# Patient Record
Sex: Male | Born: 1935 | Race: White | Hispanic: No | Marital: Married | Smoking: Never smoker
Health system: Southern US, Community
[De-identification: ages and names within clinical notes are randomized; demographics above are authoritative.]

## PROBLEM LIST (undated history)

## (undated) DIAGNOSIS — N200 Calculus of kidney: Secondary | ICD-10-CM

## (undated) DIAGNOSIS — Z9109 Other allergy status, other than to drugs and biological substances: Secondary | ICD-10-CM

## (undated) DIAGNOSIS — E7800 Pure hypercholesterolemia, unspecified: Secondary | ICD-10-CM

## (undated) HISTORY — PX: ROTATOR CUFF REPAIR: SHX139

---

## 2004-11-30 ENCOUNTER — Emergency Department (HOSPITAL_COMMUNITY): Admission: EM | Admit: 2004-11-30 | Discharge: 2004-11-30 | Payer: Self-pay | Admitting: *Deleted

## 2007-10-02 ENCOUNTER — Emergency Department (HOSPITAL_COMMUNITY): Admission: EM | Admit: 2007-10-02 | Discharge: 2007-10-02 | Payer: Self-pay | Admitting: Emergency Medicine

## 2009-10-12 ENCOUNTER — Emergency Department (HOSPITAL_COMMUNITY)
Admission: EM | Admit: 2009-10-12 | Discharge: 2009-10-12 | Payer: Self-pay | Source: Home / Self Care | Admitting: Emergency Medicine

## 2011-01-01 LAB — DIFFERENTIAL
Basophils Relative: 1
Eosinophils Relative: 2
Lymphs Abs: 0.9
Monocytes Relative: 10
Neutro Abs: 8.2 — ABNORMAL HIGH
Neutrophils Relative %: 79 — ABNORMAL HIGH

## 2011-01-01 LAB — CBC
MCHC: 34.3
MCV: 88.2
Platelets: 272

## 2011-01-01 LAB — POCT CARDIAC MARKERS
CKMB, poc: 6.3
Operator id: 257131
Troponin i, poc: <0.05

## 2011-01-01 LAB — POCT I-STAT, CHEM 8
Chloride: 100
Creatinine, Ser: 1.1
Glucose, Bld: 95
HCT: 41
Sodium: 135

## 2011-01-01 LAB — D-DIMER, QUANTITATIVE: D-Dimer, Quant: 1.18 — ABNORMAL HIGH

## 2012-12-14 ENCOUNTER — Emergency Department (HOSPITAL_COMMUNITY): Payer: Medicare Other

## 2012-12-14 ENCOUNTER — Emergency Department (HOSPITAL_COMMUNITY)
Admission: EM | Admit: 2012-12-14 | Discharge: 2012-12-14 | Disposition: A | Discharge status: Home / Self Care | Payer: Medicare Other | Attending: Emergency Medicine | Admitting: Emergency Medicine

## 2012-12-14 ENCOUNTER — Encounter (HOSPITAL_COMMUNITY): Payer: Self-pay | Admitting: Family Medicine

## 2012-12-14 DIAGNOSIS — Z87442 Personal history of urinary calculi: Secondary | ICD-10-CM | POA: Insufficient documentation

## 2012-12-14 DIAGNOSIS — W010XXA Fall on same level from slipping, tripping and stumbling without subsequent striking against object, initial encounter: Secondary | ICD-10-CM | POA: Insufficient documentation

## 2012-12-14 DIAGNOSIS — S79919A Unspecified injury of unspecified hip, initial encounter: Secondary | ICD-10-CM | POA: Insufficient documentation

## 2012-12-14 DIAGNOSIS — S76301A Unspecified injury of muscle, fascia and tendon of the posterior muscle group at thigh level, right thigh, initial encounter: Secondary | ICD-10-CM

## 2012-12-14 DIAGNOSIS — Z79899 Other long term (current) drug therapy: Secondary | ICD-10-CM | POA: Insufficient documentation

## 2012-12-14 DIAGNOSIS — E7800 Pure hypercholesterolemia, unspecified: Secondary | ICD-10-CM | POA: Insufficient documentation

## 2012-12-14 DIAGNOSIS — M79651 Pain in right thigh: Secondary | ICD-10-CM

## 2012-12-14 DIAGNOSIS — Y93H2 Activity, gardening and landscaping: Secondary | ICD-10-CM | POA: Insufficient documentation

## 2012-12-14 DIAGNOSIS — Y9289 Other specified places as the place of occurrence of the external cause: Secondary | ICD-10-CM | POA: Insufficient documentation

## 2012-12-14 DIAGNOSIS — IMO0002 Reserved for concepts with insufficient information to code with codable children: Secondary | ICD-10-CM | POA: Insufficient documentation

## 2012-12-14 HISTORY — DX: Calculus of kidney: N20.0

## 2012-12-14 HISTORY — DX: Pure hypercholesterolemia, unspecified: E78.00

## 2012-12-14 LAB — URINALYSIS, DIPSTICK ONLY
Nitrite: NEGATIVE
Specific Gravity, Urine: 1.009 (ref 1.005–1.030)

## 2012-12-14 MED ORDER — METHOCARBAMOL 500 MG PO TABS
500.0000 mg | ORAL_TABLET | Freq: Once | ORAL | Status: AC
  Administered 2012-12-14: 500 mg via ORAL
  Filled 2012-12-14: qty 1

## 2012-12-14 MED ORDER — HYDROCODONE-ACETAMINOPHEN 5-325 MG PO TABS: 1.0000 | ORAL_TABLET | Freq: Four times a day (QID) | ORAL | Status: AC | PRN

## 2012-12-14 MED ORDER — MORPHINE SULFATE 4 MG/ML IJ SOLN
4.0000 mg | Freq: Once | INTRAMUSCULAR | Status: AC
  Administered 2012-12-14: 4 mg via INTRAMUSCULAR
  Filled 2012-12-14: qty 1

## 2012-12-14 MED ORDER — OXYCODONE-ACETAMINOPHEN 5-325 MG PO TABS
1.0000 | ORAL_TABLET | Freq: Once | ORAL | Status: AC
  Administered 2012-12-14: 1 via ORAL
  Filled 2012-12-14: qty 1

## 2012-12-14 NOTE — ED Notes (Signed)
Called x 2 no answer

## 2012-12-14 NOTE — ED Notes (Signed)
Pt presents with right leg pain located beneath buttocks that radiates down to right knee, pt states when he stands the pain is unbearable.  Pt has had the pain since Friday- has been taking Motrin at home for pain without relief.  Pedal pulses strong and intact.  Heat pack applied to area.

## 2012-12-14 NOTE — ED Notes (Signed)
Per pt sts Friday he fell while trimming some branches out of a tree and injured right hip. sts the pain became worse over the weekend and then this morning it gave out and he fell again. Pt able to ambulate but with severe pain.

## 2012-12-14 NOTE — ED Provider Notes (Signed)
CSN: [id number]     Arrival date & time 12/14/12  1301 History   First MD Initiated Contact with Patient 12/14/12 1646     Chief Complaint  Patient presents with  . Fall  . Hip Pain   (Consider location/radiation/quality/duration/timing/severity/associated sxs/prior Treatment) Patient is a 77 y.o. male presenting with fall. The history is provided by the patient. No language interpreter was used.  Fall This is a new problem. The current episode started in the past 7 days. The problem occurs constantly. The problem has been unchanged. Pertinent negatives include no abdominal pain, chest pain, congestion, coughing, fever, headaches, joint swelling, nausea, rash, sore throat or vomiting. The symptoms are aggravated by twisting. He has tried NSAIDs and oral narcotics for the symptoms. The treatment provided mild relief.    Past Medical History  Diagnosis Date  . High cholesterol   . Kidney stone    History reviewed. No pertinent past surgical history. History reviewed. No pertinent family history. History  Substance Use Topics  . Smoking status: Never Smoker   . Smokeless tobacco: Not on file  . Alcohol Use: No    Review of Systems  Constitutional: Negative for fever.  HENT: Negative for congestion, sore throat and rhinorrhea.   Respiratory: Negative for cough and shortness of breath.   Cardiovascular: Negative for chest pain.  Gastrointestinal: Negative for nausea, vomiting, abdominal pain and diarrhea.  Genitourinary: Negative for dysuria and hematuria.  Musculoskeletal: Negative for joint swelling.  Skin: Negative for rash.  Neurological: Negative for syncope, light-headedness and headaches.  All other systems reviewed and are negative.    Allergies  Contrast media; Corn-containing products; Milk-related compounds; and Shrimp  Home Medications   Current Outpatient Rx  Name  Route  Sig  Dispense  Refill  . cetirizine (ZYRTEC) 10 MG tablet   Oral   Take 10 mg by mouth  daily.         Marland Kitchen dipyridamole-aspirin (AGGRENOX) 200-25 MG per 12 hr capsule   Oral   Take 1 capsule by mouth 2 (two) times daily.         . fish oil-omega-3 fatty acids 1000 MG capsule   Oral   Take 2 g by mouth 3 (three) times daily.         . fluticasone (FLONASE) 50 MCG/ACT nasal spray   Nasal   Place 1 spray into the nose daily.         Marland Kitchen gabapentin (NEURONTIN) 600 MG tablet   Oral   Take 600 mg by mouth 3 (three) times daily.         . montelukast (SINGULAIR) 10 MG tablet   Oral   Take 10 mg by mouth at bedtime.         . Multiple Vitamin (MULTIVITAMIN WITH MINERALS) TABS tablet   Oral   Take 1 tablet by mouth daily.         . Saw Palmetto 450 MG CAPS   Oral   Take by mouth.         . simvastatin (ZOCOR) 10 MG tablet   Oral   Take 10 mg by mouth at bedtime.          BP 144/73  Pulse 62  Temp(Src) 98.3 F (36.8 C)  Resp 18  SpO2 97% Physical Exam  Nursing note and vitals reviewed. Constitutional: He is oriented to person, place, and time. He appears well-developed and well-nourished.  HENT:  Head: Normocephalic and atraumatic.  Right Ear: External ear  normal.  Left Ear: External ear normal.  Eyes: EOM are normal. Pupils are equal, round, and reactive to light.  Neck: Normal range of motion. Neck supple.  Cardiovascular: Normal rate, regular rhythm and intact distal pulses.  Exam reveals no gallop and no friction rub.   No murmur heard. Pulmonary/Chest: Effort normal and breath sounds normal. No respiratory distress. He has no wheezes. He has no rales. He exhibits no tenderness.  Abdominal: Soft. Bowel sounds are normal. He exhibits no distension. There is no tenderness. There is no rebound.  Musculoskeletal: He exhibits tenderness. He exhibits no edema.  Right Lower Extremity INSPECTION: ecchymosis of posterior thigh PALPATION: No TTP ROM: Decreased global ROM due to pain. VASCULAR: Extremity warm and well-perfused. 2+ dosalis pedis  and posterior tibialis pulses. Capillary refill <2 seconds NEURO-SENSORY: Normal sensibility to light touch in DP/SP/sural/saphenous distributions. No numbness or paresthesias. No focal sensory deficit. NEURO-MOTOR: Intact EHL/FHL/TA/GS motor function. No focal motor deficit.  Lymphadenopathy:    He has no cervical adenopathy.  Neurological: He is alert and oriented to person, place, and time.  Skin: Skin is warm. No rash noted.  Psychiatric: He has a normal mood and affect. His behavior is normal.    ED Course  Procedures (including critical care time) Labs Review Labs Reviewed  URINALYSIS, DIPSTICK ONLY   Imaging Review Dg Hip Complete Right  12/14/2012   CLINICAL DATA:  Pain post fall 1 week ago , right hip pain radiating in right leg  EXAM: RIGHT HIP - COMPLETE 2+ VIEW  COMPARISON:  None.  FINDINGS: There is no evidence of hip fracture or dislocation. Mild degenerative changes are noted with bilateral of superior or hip joint space. Minimal superior acetabular spurring.  IMPRESSION: No acute fracture or subluxation. Mild degenerative changes right hip joint.   Electronically Signed   By: Natasha Mead   On: 12/14/2012 14:36    MDM   1. Hamstring injury, right, initial encounter   2. Right thigh pain    4:47 PM Pt is a 77 y.o. male with pertinent PMHX of TIA, HLD who presents to the ED with fall, hip pain. Pt presents with injury to right lower extremity sustained on Friday after falling backwards with a twisting motion. Pt denies LOC, denies amnesia to the event. Denies syncope or lightheadedness prior to fall. Did not strike hip directly. Pt endorses some dysuria. No fevers  On exam AFVSS, NVI, , pulses present. Sensation and motor intact. Plan to obtain XR right hip complete. Will give 1 time dose morphine IM and robaxin PO.  Urine dip negative for blood, pt concerned he had hematuria.  hip and pelvis AP/LAt per my read showed no acute fractures or dislocation. Pt able to ambulate  without difficulty. Pt has pain with twisting and turning on right leg consistent with hamstring injury. No pain with bearing weight, low clinical suspicion for acute or occult hip fracture. Plan for discharge with close return precautions. Pt given script for Vicodin and instructed to use ibuprofen for pain also. Pt in agreement with plan  6:08 PM: I have discussed the diagnosis/risks/treatment options with the patient and family and believe the pt to be eligible for discharge home to follow-up with PCP in 1 week. We also discussed returning to the ED immediately if new or worsening sx occur. We discussed the sx which are most concerning (e.g., worsening pain after 10 days) that necessitate immediate return. Any new prescriptions provided to the patient are listed below.  Discharge Medication List as of 12/14/2012  6:10 PM    START taking these medications   Details  HYDROcodone-acetaminophen (NORCO/VICODIN) 5-325 MG per tablet Take 1 tablet by mouth every 6 (six) hours as needed for pain., Starting 12/14/2012, Until Discontinued, Print       The patient appears reasonably screened and/or stabilized for discharge and I doubt any other medical condition or other [facility] requiring further screening, evaluation or treatment in the ED at this time prior to discharge . Pt in agreement with discharge plan. Return precautions given. Pt discharged VSS  Imaging reviewed by myself and considered in medical decision making if ordered.  Imaging interpreted by radiology. Pt was discussed with my attending, Dr. Doristine Mango, MD 12/15/12 [phone]

## 2012-12-15 NOTE — ED Provider Notes (Signed)
I saw and evaluated the patient, reviewed the resident's note and I agree with the findings and plan.   .Face to face Exam:  General:  Awake HEENT:  Atraumatic Resp:  Normal effort Abd:  Nondistended Neuro:No focal weakness   Nelia Shi, MD 12/15/12 2214

## 2014-10-13 ENCOUNTER — Encounter (HOSPITAL_BASED_OUTPATIENT_CLINIC_OR_DEPARTMENT_OTHER): Payer: Self-pay | Admitting: *Deleted

## 2014-10-13 ENCOUNTER — Emergency Department (HOSPITAL_BASED_OUTPATIENT_CLINIC_OR_DEPARTMENT_OTHER)
Admission: EM | Admit: 2014-10-13 | Discharge: 2014-10-13 | Disposition: A | Discharge status: Home / Self Care | Payer: Medicare Other | Attending: Emergency Medicine | Admitting: Emergency Medicine

## 2014-10-13 DIAGNOSIS — S70362A Insect bite (nonvenomous), left thigh, initial encounter: Secondary | ICD-10-CM | POA: Diagnosis not present

## 2014-10-13 DIAGNOSIS — E780 Pure hypercholesterolemia: Secondary | ICD-10-CM | POA: Insufficient documentation

## 2014-10-13 DIAGNOSIS — Y9389 Activity, other specified: Secondary | ICD-10-CM | POA: Diagnosis not present

## 2014-10-13 DIAGNOSIS — Y998 Other external cause status: Secondary | ICD-10-CM | POA: Insufficient documentation

## 2014-10-13 DIAGNOSIS — Z8709 Personal history of other diseases of the respiratory system: Secondary | ICD-10-CM | POA: Insufficient documentation

## 2014-10-13 DIAGNOSIS — S30861A Insect bite (nonvenomous) of abdominal wall, initial encounter: Secondary | ICD-10-CM | POA: Diagnosis present

## 2014-10-13 DIAGNOSIS — Z87442 Personal history of urinary calculi: Secondary | ICD-10-CM | POA: Diagnosis not present

## 2014-10-13 DIAGNOSIS — Y9289 Other specified places as the place of occurrence of the external cause: Secondary | ICD-10-CM | POA: Insufficient documentation

## 2014-10-13 DIAGNOSIS — Z79899 Other long term (current) drug therapy: Secondary | ICD-10-CM | POA: Insufficient documentation

## 2014-10-13 DIAGNOSIS — Z7951 Long term (current) use of inhaled steroids: Secondary | ICD-10-CM | POA: Insufficient documentation

## 2014-10-13 DIAGNOSIS — W57XXXA Bitten or stung by nonvenomous insect and other nonvenomous arthropods, initial encounter: Secondary | ICD-10-CM | POA: Diagnosis not present

## 2014-10-13 HISTORY — DX: Other allergy status, other than to drugs and biological substances: Z91.09

## 2014-10-13 MED ORDER — DOXYCYCLINE HYCLATE 100 MG PO CAPS: 100.0000 mg | ORAL_CAPSULE | Freq: Two times a day (BID) | ORAL | Status: AC

## 2014-10-13 MED ORDER — HYDROXYZINE HCL 25 MG PO TABS: 25.0000 mg | ORAL_TABLET | Freq: Four times a day (QID) | ORAL | Status: AC

## 2014-10-13 NOTE — ED Provider Notes (Signed)
CSN: [id number]   Arrival date & time 10/13/14 1506  History  This chart was scribed for  Thomas Lyons, MD by Bethel Born, ED Scribe. This patient was seen in room MH07/MH07 and the patient's care was started at [facility] PM.  Chief Complaint  Patient presents with  . Insect Bite    HPI Patient is a 79 y.o. male presenting with animal bite. The history is provided by the patient and the spouse. No language interpreter was used.  Animal Bite Contact animal:  Insect Location:  Torso and leg Torso injury location:  Abd RLQ Leg injury location:  L hip Time since incident:  3 days Pain details:    Quality:  Itching   Severity:  Severe   Timing:  Constant   Progression:  Worsening Relieved by:  Nothing Worsened by:  Nothing tried Ineffective treatments:  None tried Associated symptoms: swelling   Associated symptoms: no fever    Thomas Reese is a 79 y.o. male who presents to the Emergency Department complaining of tick bites to the left hip with onset 3 days ago. The pt scratched the "tiny" tick off in the shower. He noticed another tick on the right side today. Associated itching, redness, and swelling at both sites. No fever or chills.   Past Medical History  Diagnosis Date  . High cholesterol   . Kidney stone   . Environmental allergies     Past Surgical History  Procedure Laterality Date  . Rotator cuff repair      No family history on file.  History  Substance Use Topics  . Smoking status: Never Smoker   . Smokeless tobacco: Never Used  . Alcohol Use: No     Review of Systems  Constitutional: Negative for fever and chills.  Skin: Positive for itching.       2 areas of redness and swelling  All other systems reviewed and are negative.   Home Medications   Prior to Admission medications   Medication Sig Start Date End Date Taking? Authorizing Provider  cetirizine (ZYRTEC) 10 MG tablet Take 10 mg by mouth daily.   Yes Historical Provider, MD   dipyridamole-aspirin (AGGRENOX) 200-25 MG per 12 hr capsule Take 1 capsule by mouth 2 (two) times daily.   Yes Historical Provider, MD  fish oil-omega-3 fatty acids 1000 MG capsule Take 2 g by mouth 3 (three) times daily.   Yes Historical Provider, MD  fluticasone (FLONASE) 50 MCG/ACT nasal spray Place 1 spray into the nose daily.   Yes Historical Provider, MD  gabapentin (NEURONTIN) 600 MG tablet Take 600 mg by mouth 3 (three) times daily.   Yes Historical Provider, MD  montelukast (SINGULAIR) 10 MG tablet Take 10 mg by mouth at bedtime.   Yes Historical Provider, MD  Multiple Vitamin (MULTIVITAMIN WITH MINERALS) TABS tablet Take 1 tablet by mouth daily.   Yes Historical Provider, MD  Saw Palmetto 450 MG CAPS Take by mouth.   Yes Historical Provider, MD  simvastatin (ZOCOR) 10 MG tablet Take 10 mg by mouth at bedtime.   Yes Historical Provider, MD  HYDROcodone-acetaminophen (NORCO/VICODIN) 5-325 MG per tablet Take 1 tablet by mouth every 6 (six) hours as needed for pain. 12/14/12   Donalee Citrin, MD    Allergies  Contrast media  Triage Vitals: BP 136/58 mmHg  Pulse 67  Temp(Src) 98 F (36.7 C) (Oral)  Resp 18  Ht 5' 8.5" (1.74 m)  Wt 187 lb (84.823 kg)  BMI 28.02 kg/m2  SpO2 96%  Physical Exam  Constitutional: He is oriented to person, place, and time and well-developed, well-nourished, and in no distress. No distress.  HENT:  Head: Normocephalic and atraumatic.  Neck: Normal range of motion. Neck supple.  Neurological: He is alert and oriented to person, place, and time.  Skin: He is not diaphoretic.  There is an area of redness, swelling, and warmth with a central puncture noted to the right lower abdominal wall. The area of redness measures approximately 8 cm.  There is another area on the left thigh with lesser erythema and central puncture.   Nursing note and vitals reviewed.   ED Course  Procedures   DIAGNOSTIC STUDIES: Oxygen Saturation is 96% on RA, normal by my  interpretation.    COORDINATION OF CARE: 5:08 PM Discussed treatment plan which includes discharge with doxycycline and hydroxyzine with pt at bedside and pt agreed to plan.  Labs Review- Labs Reviewed - No data to display  Imaging Review No results found.  EKG Interpretation None      MDM   Final diagnoses:  None     [name] presents with a reddened area to his left thigh after right waist removing 2 takes. The area to his abdomen is red, warm, and swollen. This will be treated with doxycycline and when necessary return.   I personally performed the services described in this documentation, which was scribed in my presence. The recorded information has been reviewed and is accurate.      Thomas Lyonsouglas Alivia Cimino, MD 10/15/14 [phone]

## 2014-10-13 NOTE — ED Notes (Signed)
Pt reports he had tick bite to left thigh on Thursday, then another to waist yesterday- both areas red and swollen

## 2014-10-13 NOTE — Discharge Instructions (Signed)
Doxycycline as prescribed.  Hydroxyzine as prescribed as needed for itching.  Return to the emergency department if symptoms significantly worsen or change.   Tick Bite Information Ticks are insects that attach themselves to the skin and draw blood for food. There are various types of ticks. Common types include wood ticks and deer ticks. Most ticks live in shrubs and grassy areas. Ticks can climb onto your body when you make contact with leaves or grass where the tick is waiting. The most common places on the body for ticks to attach themselves are the scalp, neck, armpits, waist, and groin. Most tick bites are harmless, but sometimes ticks carry germs that cause diseases. These germs can be spread to a person during the tick's feeding process. The chance of a disease spreading through a tick bite depends on:   The type of tick.  Time of year.   How long the tick is attached.   Geographic location.  HOW CAN YOU PREVENT TICK BITES? Take these steps to help prevent tick bites when you are outdoors:  Wear protective clothing. Long sleeves and long pants are best.   Wear white clothes so you can see ticks more easily.  Tuck your pant legs into your socks.   If walking on a trail, stay in the middle of the trail to avoid brushing against bushes.  Avoid walking through areas with long grass.  Put insect repellent on all exposed skin and along boot tops, pant legs, and sleeve cuffs.   Check clothing, hair, and skin repeatedly and before going inside.   Brush off any ticks that are not attached.  Take a shower or bath as soon as possible after being outdoors.  WHAT IS THE PROPER WAY TO REMOVE A TICK? Ticks should be removed as soon as possible to help prevent diseases caused by tick bites. 1. If latex gloves are available, put them on before trying to remove a tick.  2. Using fine-point tweezers, grasp the tick as close to the skin as possible. You may also use curved  forceps or a tick removal tool. Grasp the tick as close to its head as possible. Avoid grasping the tick on its body. 3. Pull gently with steady upward pressure until the tick lets go. Do not twist the tick or jerk it suddenly. This may break off the tick's head or mouth parts. 4. Do not squeeze or crush the tick's body. This could force disease-carrying fluids from the tick into your body.  5. After the tick is removed, wash the bite area and your hands with soap and water or other disinfectant such as alcohol. 6. Apply a small amount of antiseptic cream or ointment to the bite site.  7. Wash and disinfect any instruments that were used.  Do not try to remove a tick by applying a hot match, petroleum jelly, or fingernail polish to the tick. These methods do not work and may increase the chances of disease being spread from the tick bite.  WHEN SHOULD YOU SEEK MEDICAL CARE? Contact your health care provider if you are unable to remove a tick from your skin or if a part of the tick breaks off and is stuck in the skin.  After a tick bite, you need to be aware of signs and symptoms that could be related to diseases spread by ticks. Contact your health care provider if you develop any of the following in the days or weeks after the tick bite:  Unexplained fever.  Rash. A circular rash that appears days or weeks after the tick bite may indicate the possibility of Lyme disease. The rash may resemble a target with a bull's-eye and may occur at a different part of your body than the tick bite.  Redness and swelling in the area of the tick bite.   Tender, swollen lymph glands.   Diarrhea.   Weight loss.   Cough.   Fatigue.   Muscle, joint, or bone pain.   Abdominal pain.   Headache.   Lethargy or a change in your level of consciousness.  Difficulty walking or moving your legs.   Numbness in the legs.   Paralysis.  Shortness of breath.   Confusion.   Repeated  vomiting.  Document Released: 03/20/2000 Document Revised: 01/11/2013 Document Reviewed: 08/31/2012 [facility] Patient Information 2015 [employer], [place]. This information is not intended to replace advice given to you by your health care provider. Make sure you discuss any questions you have with your health care provider.

## 2014-10-13 NOTE — ED Notes (Signed)
MD at bedside. 

## 2017-09-09 ENCOUNTER — Encounter (HOSPITAL_BASED_OUTPATIENT_CLINIC_OR_DEPARTMENT_OTHER): Payer: Self-pay | Admitting: *Deleted

## 2017-09-09 ENCOUNTER — Emergency Department (HOSPITAL_BASED_OUTPATIENT_CLINIC_OR_DEPARTMENT_OTHER): Payer: Medicare Other

## 2017-09-09 ENCOUNTER — Other Ambulatory Visit: Payer: Self-pay

## 2017-09-09 ENCOUNTER — Emergency Department (HOSPITAL_BASED_OUTPATIENT_CLINIC_OR_DEPARTMENT_OTHER)
Admission: EM | Admit: 2017-09-09 | Discharge: 2017-09-09 | Disposition: A | Discharge status: Home / Self Care | Payer: Medicare Other | Attending: Emergency Medicine | Admitting: Emergency Medicine

## 2017-09-09 DIAGNOSIS — Y9301 Activity, walking, marching and hiking: Secondary | ICD-10-CM | POA: Diagnosis not present

## 2017-09-09 DIAGNOSIS — Y929 Unspecified place or not applicable: Secondary | ICD-10-CM | POA: Insufficient documentation

## 2017-09-09 DIAGNOSIS — Z23 Encounter for immunization: Secondary | ICD-10-CM | POA: Insufficient documentation

## 2017-09-09 DIAGNOSIS — Y999 Unspecified external cause status: Secondary | ICD-10-CM | POA: Insufficient documentation

## 2017-09-09 DIAGNOSIS — Z79899 Other long term (current) drug therapy: Secondary | ICD-10-CM | POA: Insufficient documentation

## 2017-09-09 DIAGNOSIS — S59912A Unspecified injury of left forearm, initial encounter: Secondary | ICD-10-CM | POA: Diagnosis present

## 2017-09-09 DIAGNOSIS — W228XXA Striking against or struck by other objects, initial encounter: Secondary | ICD-10-CM | POA: Insufficient documentation

## 2017-09-09 DIAGNOSIS — S51812A Laceration without foreign body of left forearm, initial encounter: Secondary | ICD-10-CM

## 2017-09-09 MED ORDER — LIDOCAINE-EPINEPHRINE (PF) 2 %-1:200000 IJ SOLN
10.0000 mL | Freq: Once | INTRAMUSCULAR | Status: DC
  Filled 2017-09-09: qty 10

## 2017-09-09 MED ORDER — TETANUS-DIPHTH-ACELL PERTUSSIS 5-2.5-18.5 LF-MCG/0.5 IM SUSP
0.5000 mL | Freq: Once | INTRAMUSCULAR | Status: AC
  Administered 2017-09-09: 0.5 mL via INTRAMUSCULAR
  Filled 2017-09-09: qty 0.5

## 2017-09-09 MED ORDER — BACITRACIN ZINC 500 UNIT/GM EX OINT
TOPICAL_OINTMENT | Freq: Once | CUTANEOUS | Status: AC
  Administered 2017-09-09: 17:00:00 via TOPICAL

## 2017-09-09 NOTE — ED Triage Notes (Signed)
Pt c/o left arm lac by fall on stone x 2 hrs ago

## 2017-09-09 NOTE — ED Notes (Signed)
ED Provider at bedside for lac repair 

## 2017-09-09 NOTE — Discharge Instructions (Signed)
It was my pleasure taking care of you today!   Keep wound clean with mild soap and water. Keep area covered with a topical antibiotic ointment and bandage, keep bandage dry, and do not submerge in water for 24 hours. Ice and elevate for additional pain relief and swelling. Alternate between ibuprofen and Tylenol for additional pain relief. Follow up with your primary care doctor, Urgent Care Center or ER in approximately 7-10 days for wound recheck and suture removal. Monitor area for signs of infection to include, but not limited to: increasing pain, spreading redness, drainage/pus, worsening swelling, or fevers. Return to emergency department for emergent changing or worsening symptoms.   WOUND CARE Keep area clean and dry for 24 hours. Do not remove bandage, if applied. After 24 hours,you should change it at least once a day. Also, change the dressing if it becomes wet or dirty, or as directed by your caregiver.  Wash the wound with soap and water 2 times a day. Rinse the wound off with water to remove all soap. Pat the wound dry with a clean towel.  You may shower as usual after the first 24 hours. Do not soak the wound in water until the sutures are removed.  Return if you experience any of the following signs of infection: Swelling, redness, pus drainage, streaking, fever >101.0 F Return if you experience excessive bleeding that does not stop after 15-20 minutes of constant, firm pressure.

## 2017-09-09 NOTE — ED Provider Notes (Signed)
[facility] EMERGENCY DEPARTMENT Provider Note   CSN: [id number] Arrival date & time: 09/09/17  1509     History   Chief Complaint Chief Complaint  Patient presents with  . Laceration    HPI Thomas Reese is a 82 y.o. male.  The history is provided by the patient and medical records. No language interpreter was used.  Laceration     Thomas Reese is a 82 y.o. male  with a PMH as listed below who presents to the Emergency Department complaining of laceration to the left forearm occurred just prior to arrival.  Patient states that he was walking when he tripped and hit left forearm on a cement block.  He denies any pain to the arm other than the laceration site itself.  His wife applied tape to the area, but has not cleaned it or taken any medications.  He is on Aggrenox which has about 25 mg of aspirin in it, but no other anticoagulation.  Numbness, tingling or weakness.  He did not hit his head.  Denies any other injuries.  Past Medical History:  Diagnosis Date  . Environmental allergies   . High cholesterol   . Kidney stone     There are no active problems to display for this patient.   Past Surgical History:  Procedure Laterality Date  . ROTATOR CUFF REPAIR          Home Medications    Prior to Admission medications   Medication Sig Start Date End Date Taking? Authorizing Provider  cetirizine (ZYRTEC) 10 MG tablet Take 10 mg by mouth daily.    [provider]  dipyridamole-aspirin (AGGRENOX) 200-25 MG per 12 hr capsule Take 1 capsule by mouth 2 (two) times daily.    [provider]  doxycycline (VIBRAMYCIN) 100 MG capsule Take 1 capsule (100 mg total) by mouth 2 (two) times daily. One po bid x 7 days 10/13/14   Geoffery Lyons, MD  fish oil-omega-3 fatty acids 1000 MG capsule Take 2 g by mouth 3 (three) times daily.    [provider]  fluticasone (FLONASE) 50 MCG/ACT nasal spray Place 1 spray into the nose daily.    [provider]  gabapentin (NEURONTIN) 600 MG tablet Take 600 mg by mouth 3 (three) times daily.    [provider]  HYDROcodone-acetaminophen (NORCO/VICODIN) 5-325 MG per tablet Take 1 tablet by mouth every 6 (six) hours as needed for pain. 12/14/12   Donalee Citrin, MD  hydrOXYzine (ATARAX/VISTARIL) 25 MG tablet Take 1 tablet (25 mg total) by mouth every 6 (six) hours. 10/13/14   Geoffery Lyons, MD  montelukast (SINGULAIR) 10 MG tablet Take 10 mg by mouth at bedtime.    [provider]  Multiple Vitamin (MULTIVITAMIN WITH MINERALS) TABS tablet Take 1 tablet by mouth daily.    [provider]  Saw Palmetto 450 MG CAPS Take by mouth.    [provider]  simvastatin (ZOCOR) 10 MG tablet Take 10 mg by mouth at bedtime.    [provider]    Family History History reviewed. No pertinent family history.  Social History Social History   Tobacco Use  . Smoking status: Never Smoker  . Smokeless tobacco: Never Used  Substance Use Topics  . Alcohol use: No  . Drug use: No     Allergies   Contrast media [iodinated diagnostic agents]   Review of Systems Review of Systems  Musculoskeletal: Positive for myalgias. Negative for arthralgias.  Skin: Positive for wound.  Neurological: Negative for weakness and numbness.     Physical Exam Updated Vital Signs BP 138/79   Pulse 85   Temp 98.4 F (36.9 C)   Resp 18   Ht 5\' 8"  (1.727 m)   Wt 81.6 kg (180 lb)   SpO2 96%   BMI 27.37 kg/m   Physical Exam  Constitutional: He appears well-developed and well-nourished. No distress.  HENT:  Head: Normocephalic and atraumatic.  Neck: Neck supple.  Cardiovascular: Normal rate, regular rhythm and normal heart sounds.  No murmur heard. Pulmonary/Chest: Effort normal and breath sounds normal. No respiratory distress. He has no wheezes. He has no rales.  Musculoskeletal:  Left upper extremity with full ROM and 5/5 strength. Moves all fingers without  difficulty.  Sensation intact to radial, median and ulnar nerve distributions.  Good cap refill.  Good grip strength.  Neurological: He is alert.  Skin: Skin is warm and dry.  2.5 cm laceration to left forearm  Nursing note and vitals reviewed.    ED Treatments / Results  Labs (all labs ordered are listed, but only abnormal results are displayed) Labs Reviewed - No data to display  EKG None  Radiology Dg Forearm Left  Result Date: 09/09/2017 CLINICAL DATA:  Patient fell and landed on stones, causing laceration. Pain. EXAM: LEFT FOREARM - 2 VIEW COMPARISON:  None. FINDINGS: No fracture or dislocation. Mild dorsal soft tissue swelling. A few tiny radiodensities are observed on the lateral radiograph, but cannot be seen on the AP view, which could represent foreign material. Correlate clinically. IMPRESSION: No visible fracture. A few tiny radiodensities are seen overlying an area of soft tissue swelling. See discussion above. Electronically Signed   By: Elsie Stain M.D.   On: 09/09/2017 16:06    Procedures .Marland KitchenLaceration Repair Date/Time: 09/09/2017 4:31 PM Performed by: Asir Bingley, Chase Picket, PA-C Authorized by: Arilyn Brierley, Chase Picket, PA-C   Consent:    Consent obtained:  Verbal   Consent given by:  Patient   Risks discussed:  Pain, infection, poor cosmetic result and poor wound healing Anesthesia (see MAR for exact dosages):    Anesthesia method:  Local infiltration   Local anesthetic:  Lidocaine 2% WITH epi Laceration details:    Location:  Shoulder/arm   Shoulder/arm location:  L lower arm   Length (cm):  2.5 Repair type:    Repair type:  Simple Pre-procedure details:    Preparation:  Patient was prepped and draped in usual sterile fashion and imaging obtained to evaluate for foreign bodies Exploration:    Hemostasis achieved with:  Direct pressure and epinephrine   Wound exploration: wound explored through full range of motion and entire depth of wound probed and visualized     Treatment:    Area cleansed with:  Saline and Betadine   Amount of cleaning:  Extensive   Irrigation solution:  Sterile saline   Irrigation volume:  1L   Irrigation method:  Syringe   Visualized foreign bodies/material removed: yes   Mucous membrane repair:    Suture size:  5-0   Suture material:  Vicryl   Suture technique:  Simple interrupted   Number of sutures:  1 Skin repair:    Repair method:  Sutures   Suture size:  5-0   Suture material:  Prolene   Suture technique:  Simple interrupted   Number of sutures:  4 Approximation:    Approximation:  Close Post-procedure details:    Dressing:  Antibiotic ointment  Patient tolerance of procedure:  Tolerated well, no immediate complications   (including critical care time)  Medications Ordered in ED Medications  lidocaine-EPINEPHrine (XYLOCAINE W/EPI) 2 %-1:200000 (PF) injection 10 mL (has no administration in time range)  bacitracin ointment (has no administration in time range)  Tdap (BOOSTRIX) injection 0.5 mL (0.5 mLs Intramuscular Given 09/09/17 1609)     Initial Impression / Assessment and Plan / ED Course  I have reviewed the triage vital signs and the nursing notes.  Pertinent labs & imaging results that were available during my care of the patient were reviewed by me and considered in my medical decision making (see chart for details).    Thomas Reese is a 82 y.o. male who presents to ED for laceration of left forearm. X-ray obtained prior to wound cleaning. No fracture, does show a few tiny possible foreign bodies overlying soft tissue. Wound was extensively irrigated in ED by me and 4-5 pinpoint fb's likely cement or rock were expressed with irrigation. Wound clean and lac repaired as dictated above. Patient counseled on home wound care. Follow up with PCP/urgent care or return to ER for suture removal in 7-10 days. Patient was urged to return to the Emergency Department for worsening pain, swelling, expanding  erythema especially if it streaks away from the affected area, fever, or for any additional concerns. Patient verbalized understanding. All questions answered.  Patient seen by and discussed with Dr. Clarene DukeLittle who agrees with treatment plan.    Final Clinical Impressions(s) / ED Diagnoses   Final diagnoses:  Laceration of left forearm, initial encounter    ED Discharge Orders    None       Tazaria Dlugosz, Chase PicketJaime Pilcher, PA-C 09/09/17 1635    Little, Ambrose Finlandachel Morgan, MD 09/09/17 1742

## 2019-11-07 IMAGING — DX DG FOREARM 2V*L*
2 series · 2 of 2 positions shown · non-contrast
Comparison: None.

CLINICAL DATA: Patient fell and landed on stones, causing
laceration. Pain.

EXAM:
LEFT FOREARM - 2 VIEW

[forearm ap]
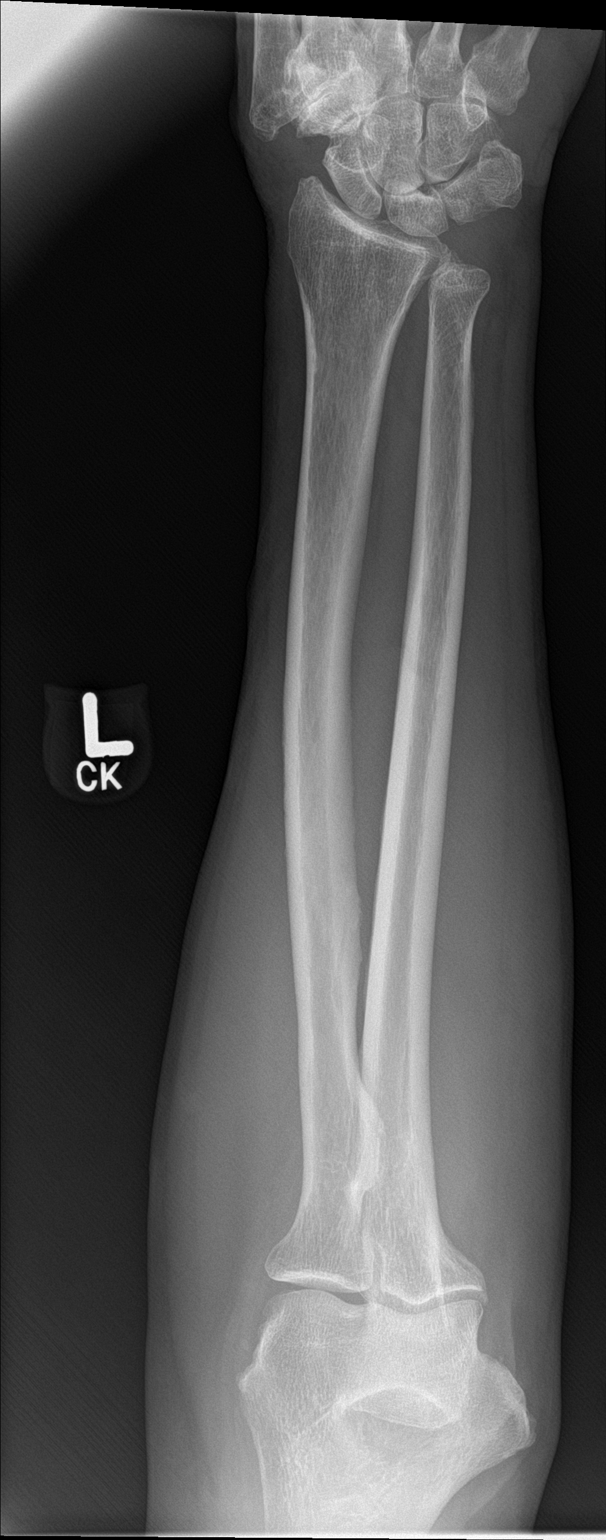

[forearm lat]
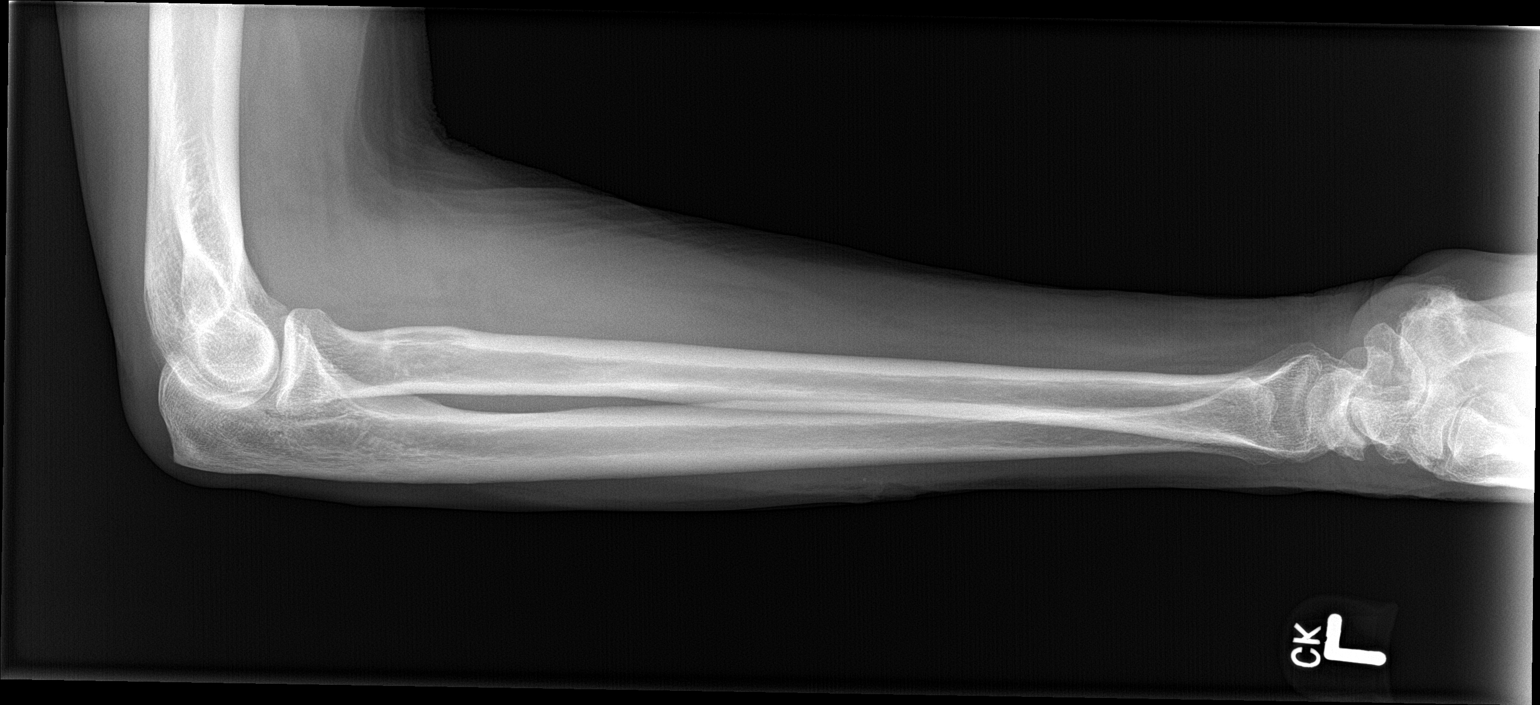

[2 of 2 positions shown; findings below may reference images not displayed]

FINDINGS: No fracture or dislocation. Mild dorsal soft tissue swelling. A few
tiny radiodensities are observed on the lateral radiograph, but
cannot be seen on the AP view, which could represent foreign
material. Correlate clinically.
IMPRESSION: No visible fracture. A few tiny radiodensities are seen overlying an
area of soft tissue swelling. See discussion above.
